# Patient Record
Sex: Female | Born: 1995 | Race: Black or African American | Hispanic: No | Marital: Single | Smoking: Never smoker
Health system: Southern US, Community
[De-identification: ages and names within clinical notes are randomized; demographics above are authoritative.]

## PROBLEM LIST (undated history)

## (undated) HISTORY — PX: TONSILLECTOMY: SUR1361

## (undated) HISTORY — PX: CYST REMOVAL NECK: SHX6281

---
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
## 2017-07-03 ENCOUNTER — Other Ambulatory Visit: Payer: Self-pay

## 2017-07-03 ENCOUNTER — Encounter: Payer: Self-pay | Admitting: Emergency Medicine

## 2017-07-03 ENCOUNTER — Emergency Department: Payer: No Typology Code available for payment source

## 2017-07-03 ENCOUNTER — Emergency Department
Admission: EM | Admit: 2017-07-03 | Discharge: 2017-07-03 | Disposition: A | Discharge status: Home / Self Care | Payer: No Typology Code available for payment source | Attending: Emergency Medicine | Admitting: Emergency Medicine

## 2017-07-03 DIAGNOSIS — Y92410 Unspecified street and highway as the place of occurrence of the external cause: Secondary | ICD-10-CM | POA: Diagnosis not present

## 2017-07-03 DIAGNOSIS — Y999 Unspecified external cause status: Secondary | ICD-10-CM | POA: Diagnosis not present

## 2017-07-03 DIAGNOSIS — M545 Low back pain, unspecified: Secondary | ICD-10-CM

## 2017-07-03 DIAGNOSIS — S3992XA Unspecified injury of lower back, initial encounter: Secondary | ICD-10-CM | POA: Diagnosis present

## 2017-07-03 DIAGNOSIS — Y9389 Activity, other specified: Secondary | ICD-10-CM | POA: Diagnosis not present

## 2017-07-03 LAB — POCT PREGNANCY, URINE: Preg Test, Ur: NEGATIVE

## 2017-07-03 MED ORDER — NAPROXEN 500 MG PO TABS: 500.0000 mg | ORAL_TABLET | Freq: Two times a day (BID) | ORAL | 0 refills | Status: DC
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
## 2019-12-22 ENCOUNTER — Emergency Department
Admission: EM | Admit: 2019-12-22 | Discharge: 2019-12-22 | Disposition: A | Discharge status: Home / Self Care | Payer: BC Managed Care – PPO | Attending: Emergency Medicine | Admitting: Emergency Medicine

## 2019-12-22 ENCOUNTER — Other Ambulatory Visit: Payer: Self-pay

## 2019-12-22 ENCOUNTER — Encounter: Payer: Self-pay | Admitting: Emergency Medicine

## 2019-12-22 DIAGNOSIS — J069 Acute upper respiratory infection, unspecified: Secondary | ICD-10-CM | POA: Diagnosis not present

## 2019-12-22 DIAGNOSIS — B309 Viral conjunctivitis, unspecified: Secondary | ICD-10-CM | POA: Diagnosis present

## 2019-12-22 MED ORDER — KETOROLAC TROMETHAMINE 0.5 % OP SOLN: 1.0000 [drp] | Freq: Four times a day (QID) | OPHTHALMIC | 0 refills | Status: AC

## 2019-12-22 MED ORDER — FLUTICASONE PROPIONATE 50 MCG/ACT NA SUSP: 2.0000 | Freq: Every day | NASAL | 0 refills | Status: AC

## 2019-12-22 MED ORDER — ERYTHROMYCIN 5 MG/GM OP OINT
1.0000 "application " | TOPICAL_OINTMENT | Freq: Four times a day (QID) | OPHTHALMIC | Status: DC
  Administered 2019-12-22: 1 via OPHTHALMIC
  Filled 2019-12-22: qty 1

## 2019-12-22 MED ORDER — FLUORESCEIN SODIUM 1 MG OP STRP
1.0000 | ORAL_STRIP | Freq: Once | OPHTHALMIC | Status: AC
  Administered 2019-12-22: 1 via OPHTHALMIC
  Filled 2019-12-22: qty 1

## 2019-12-22 MED ORDER — TETRACAINE HCL 0.5 % OP SOLN
2.0000 [drp] | Freq: Once | OPHTHALMIC | Status: AC
  Administered 2019-12-22: 2 [drp] via OPHTHALMIC
  Filled 2019-12-22: qty 4

## 2019-12-22 NOTE — Discharge Instructions (Addendum)
Your exam is consistent with a viral URI and conjunctivitis. Use the eye drops and eye ointment as directed.

## 2019-12-22 NOTE — ED Notes (Signed)
Signature pad not working, pt verbalizes understanding of d/c instructions and when to return to ED.denies questions

## 2019-12-22 NOTE — ED Triage Notes (Signed)
Pt to ED via POV stating that she thinks she has pink eye. Pt is in NAD

## 2019-12-22 NOTE — ED Provider Notes (Signed)
[facility] Emergency Department Provider Note ____________________________________________  Time seen: 1344  I have reviewed the triage vital signs and the nursing notes.  HISTORY  Chief Complaint  Conjunctivitis  HPI Brittany Wise is a 24 y.o. female presents as up to the ED for evaluation of  right eye irritation.  Patient denies any fever, chills, or sweats.  Patient denies vision loss or vertigo.  She describes right eye irritation, tearing, and light sensitivity.  She denies any foreign body sensation or purulent drainage.  She has had symptoms including cough, congestion, runny nose.  She was concerned due to a possible contact while on vacation, the patient confirmed to have "pinkeye."  History reviewed. No pertinent past medical history.  There are no problems to display for this patient.   Past Surgical History:  Procedure Laterality Date  . CYST REMOVAL NECK      Prior to Admission medications   Medication Sig Start Date End Date Taking? Authorizing Provider  fluticasone (FLONASE) 50 MCG/ACT nasal spray Place 2 sprays into both nostrils daily. 12/22/19   Jonatha Gagen, Charlesetta Ivory, PA-C  ketorolac (ACULAR) 0.5 % ophthalmic solution Place 1 drop into the right eye 4 (four) times daily. 12/22/19   Armiyah Capron, Charlesetta Ivory, PA-C    Allergies Patient has no known allergies.  History reviewed. No pertinent family history.  Social History Social History   Tobacco Use  . Smoking status: Never Smoker  . Smokeless tobacco: Never Used  Substance Use Topics  . Alcohol use: No  . Drug use: No    Review of Systems  Constitutional: Negative for fever. Eyes: Negative for visual changes. Reports eye redness and irritation ENT: Negative for sore throat. Cardiovascular: Negative for chest pain. Respiratory: Negative for shortness of breath. Gastrointestinal: Negative for abdominal pain, vomiting and diarrhea. Skin: Negative for rash. Neurological: Negative  for headaches, focal weakness or numbness. ____________________________________________  PHYSICAL EXAM:  VITAL SIGNS: ED Triage Vitals  Enc Vitals Group     BP 12/22/19 1158 117/67     Pulse Rate 12/22/19 1158 (!) 101     Resp 12/22/19 1158 16     Temp 12/22/19 1158 99 F (37.2 C)     Temp Source 12/22/19 1158 Oral     SpO2 12/22/19 1158 98 %     Weight 12/22/19 1156 160 lb (72.6 kg)     Height 12/22/19 1156 5\' 9"  (1.753 m)     Head Circumference --      Peak Flow --      Pain Score 12/22/19 1156 0     Pain Loc --      Pain Edu? --      Excl. in GC? --     Constitutional: Alert and oriented. Well appearing and in no distress. Head: Normocephalic and atraumatic. Eyes: Conjunctivae are normal. PERRL.  No gross foreign body on exam.  No fluorescein dye uptake on exam.  Normal extraocular movements Ears: Canals clear. TMs intact bilaterally. Nose: No congestion/rhinorrhea/epistaxis. Mouth/Throat: Mucous membranes are moist. Neck: Supple. No thyromegaly. Hematological/Lymphatic/Immunological: No preauricular lymphadenopathy. Cardiovascular: Normal rate, regular rhythm. Normal distal pulses. Respiratory: Normal respiratory effort.  Musculoskeletal: Nontender with normal range of motion in all extremities.  Neurologic:  Normal gait without ataxia. Normal speech and language. No gross focal neurologic deficits are appreciated. ____________________________________________  PROCEDURES  Tetracaine 2 drops OD  erythromycin ointment OD Procedures ____________________________________________  INITIAL IMPRESSION / ASSESSMENT AND PLAN / ED COURSE  DDX: corneal abrasion, retained foreign  body, conjunctivitis, iritis  Patient with ED evaluation of right eye irritation and tearing, along with symptoms consistent with a likely viral URI.  Patient clinical exam is overall benign reassuring at this time.  No signs of blepharitis, retained foreign body, or corneal abrasion.  No significant  conjunctival injection or edema on exam.  Patient will be treated empirically for viral conductive-itis with Acular drops.  She is also given a dispensed tube of erythromycin ointment for comfort.  Work note is provided for 24 hours as is appropriate and the patient is referred to her PCP or this ED for ongoing symptoms.  Messina Kosinski was evaluated in Emergency Department on 12/22/2019 for the symptoms described in the history of present illness. She was evaluated in the context of the global COVID-19 pandemic, which necessitated consideration that the patient might be at risk for infection with the SARS-CoV-2 virus that causes COVID-19. Institutional protocols and algorithms that pertain to the evaluation of patients at risk for COVID-19 are in a state of rapid change based on information released by regulatory bodies including the CDC and federal and state organizations. These policies and algorithms were followed during the patient's care in the ED. ____________________________________________  FINAL CLINICAL IMPRESSION(S) / ED DIAGNOSES  Final diagnoses:  Viral conjunctivitis  Viral URI      Karmen Stabs, Charlesetta Ivory, PA-C 12/22/19 1625    Minna Antis, MD 12/23/19 2306

## 2023-01-08 ENCOUNTER — Ambulatory Visit
Admission: RE | Admit: 2023-01-08 | Discharge: 2023-01-08 | Disposition: A | Discharge status: Home / Self Care | Payer: BC Managed Care – PPO | Source: Ambulatory Visit | Attending: Internal Medicine | Admitting: Internal Medicine

## 2023-01-08 VITALS — BP 130/82 | HR 95 | Temp 98.2°F | Resp 16

## 2023-01-08 DIAGNOSIS — J029 Acute pharyngitis, unspecified: Secondary | ICD-10-CM | POA: Insufficient documentation

## 2023-01-08 DIAGNOSIS — B349 Viral infection, unspecified: Secondary | ICD-10-CM | POA: Diagnosis present

## 2023-01-08 DIAGNOSIS — Z1152 Encounter for screening for COVID-19: Secondary | ICD-10-CM | POA: Diagnosis not present

## 2023-01-08 LAB — POCT RAPID STREP A (OFFICE): Rapid Strep A Screen: NEGATIVE

## 2023-01-08 MED ORDER — BENZONATATE 200 MG PO CAPS
200.0000 mg | ORAL_CAPSULE | Freq: Three times a day (TID) | ORAL | 0 refills | Status: AC | PRN
Start: 2023-01-08 — End: ?

## 2023-01-08 MED ORDER — IPRATROPIUM BROMIDE 0.03 % NA SOLN
2.0000 | Freq: Two times a day (BID) | NASAL | 0 refills | Status: AC
Start: 2023-01-08 — End: ?

## 2023-01-08 NOTE — Discharge Instructions (Addendum)
The clinic will contact you with results of the strep throat culture as well as the COVID testing done today.  You may take Tessalon as needed for cough.  Nasal spray as needed for congestion.  Salt water gargles and warm liquids will help with your throat pain.  Lots of rest and fluids.  Please follow-up with your PCP if your symptoms do not improve.  Please go to the emergency room if you develop any worsening symptoms.  I hope you feel better soon!

## 2023-01-08 NOTE — ED Provider Notes (Signed)
[facility]    CSN: [id number] Arrival date & time: 01/08/23  1324      History   Chief Complaint Chief Complaint  Patient presents with   Sore Throat    I believe I may have strep throat and would like to be tested. Thank you! - Entered by patient    HPI Brittany Wise is a 27 y.o. female  presents for evaluation of URI symptoms for 1 days. Patient reports associated symptoms of cough, congestion, sore throat. Denies N/V/D, fevers, ear pain, body aches, shortness of breath. 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 No known sick contacts.  Pt has taken ibuprofen OTC for symptoms. Pt has no other concerns at this time.    Sore Throat    History reviewed. No pertinent past medical history.  There are no problems to display for this patient.   Past Surgical History:  Procedure Laterality Date   CYST REMOVAL NECK     TONSILLECTOMY      OB History   No obstetric history on file.      Home Medications    Prior to Admission medications   Medication Sig Start Date End Date Taking? Authorizing Provider  benzonatate (TESSALON) 200 MG capsule Take 1 capsule (200 mg total) by mouth 3 (three) times daily as needed. 01/08/23  Yes Radford Pax, NP  ipratropium (ATROVENT) 0.03 % nasal spray Place 2 sprays into both nostrils every 12 (twelve) hours. 01/08/23  Yes Radford Pax, NP  fluticasone (FLONASE) 50 MCG/ACT nasal spray Place 2 sprays into both nostrils daily. 12/22/19   Menshew, Charlesetta Ivory, PA-C  ketorolac (ACULAR) 0.5 % ophthalmic solution Place 1 drop into the right eye 4 (four) times daily. 12/22/19   Menshew, Charlesetta Ivory, PA-C    Family History History reviewed. No pertinent family history.  Social History Social History   Tobacco Use   Smoking status: Never   Smokeless tobacco: Never  Substance Use Topics   Alcohol use: No   Drug use: No     Allergies   Patient has no known allergies.   Review of Systems Review  of Systems  HENT:  Positive for congestion and sore throat.   Respiratory:  Positive for cough.      Physical Exam Triage Vital Signs ED Triage Vitals  Encounter Vitals Group     BP 01/08/23 1336 130/82     Systolic BP Percentile --      Diastolic BP Percentile --      Pulse Rate 01/08/23 1336 95     Resp 01/08/23 1336 16     Temp 01/08/23 1336 98.2 F (36.8 C)     Temp Source 01/08/23 1336 Oral     SpO2 01/08/23 1336 98 %     Weight --      Height --      Head Circumference --      Peak Flow --      Pain Score 01/08/23 1339 3     Pain Loc --      Pain Education --      Exclude from Growth Chart --    No data found.  Updated Vital Signs BP 130/82 (BP Location: Right Arm)   Pulse 95   Temp 98.2 F (36.8 C) (Oral)   Resp 16   LMP 01/05/2023 (Approximate)   SpO2 98%   Visual Acuity Right Eye Distance:   Left Eye Distance:   Bilateral  Distance:    Right Eye Near:   Left Eye Near:    Bilateral Near:     Physical Exam Vitals and nursing note reviewed.  Constitutional:      General: She is not in acute distress.    Appearance: She is well-developed. She is not ill-appearing.  HENT:     Head: Normocephalic and atraumatic.     Right Ear: Tympanic membrane and ear canal normal.     Left Ear: Tympanic membrane and ear canal normal.     Nose: Congestion present.     Mouth/Throat:     Mouth: Mucous membranes are moist.     Pharynx: Oropharynx is clear. Uvula midline. Posterior oropharyngeal erythema present.     Tonsils: No tonsillar exudate or tonsillar abscesses.  Eyes:     Conjunctiva/sclera: Conjunctivae normal.     Pupils: Pupils are equal, round, and reactive to light.  Cardiovascular:     Rate and Rhythm: Normal rate and regular rhythm.     Heart sounds: Normal heart sounds.  Pulmonary:     Effort: Pulmonary effort is normal.     Breath sounds: Normal breath sounds.  Musculoskeletal:     Cervical back: Normal range of motion and neck supple.   Lymphadenopathy:     Cervical: No cervical adenopathy.  Skin:    General: Skin is warm and dry.  Neurological:     General: No focal deficit present.     Mental Status: She is alert and oriented to person, place, and time.  Psychiatric:        Mood and Affect: Mood normal.        Behavior: Behavior normal.      UC Treatments / Results  Labs (all labs ordered are listed, but only abnormal results are displayed) Labs Reviewed  CULTURE, GROUP A STREP ([facility])  SARS CORONAVIRUS 2 (TAT 6-24 HRS)  POCT RAPID STREP A (OFFICE)    EKG   Radiology No results found.  Procedures Procedures (including critical care time)  Medications Ordered in UC Medications - No data to display  Initial Impression / Assessment and Plan / UC Course  I have reviewed the triage vital signs and the nursing notes.  Pertinent labs & imaging results that were available during my care of the patient were reviewed by me and considered in my medical decision making (see chart for details).     Reviewed exam and symptoms with patient.  No red flags.  Negative rapid strep will culture.  COVID PCR and will contact if positive.  Discussed viral illness and symptomatic treatment.  Tessalon as needed for cough and Atrovent nasal spray for congestion.  PCP follow-up if symptoms do not improve.  ER precautions reviewed and patient verbalized understanding.  Final diagnoses:  Sore throat  Viral illness     Discharge Instructions      The clinic will contact you with results of the strep throat culture as well as the COVID testing done today.  You may take Tessalon as needed for cough.  Nasal spray as needed for congestion.  Salt water gargles and warm liquids will help with your throat pain.  Lots of rest and fluids.  Please follow-up with your PCP if your symptoms do not improve.  Please go to the emergency room if you develop any worsening symptoms.  I hope you feel better soon!   ED Prescriptions      Medication Sig Dispense Auth. Provider   benzonatate (TESSALON) 200 MG capsule  Take 1 capsule (200 mg total) by mouth 3 (three) times daily as needed. 20 capsule Radford Pax, NP   ipratropium (ATROVENT) 0.03 % nasal spray Place 2 sprays into both nostrils every 12 (twelve) hours. 30 mL Radford Pax, NP      PDMP not reviewed this encounter.   Radford Pax, NP 01/08/23 1352

## 2023-01-08 NOTE — ED Triage Notes (Signed)
Pt presents to UC w/ c/o sore throat, nasal congestion since last night. Ibuprofen helped some.

## 2023-01-09 LAB — SARS CORONAVIRUS 2 (TAT 6-24 HRS): SARS Coronavirus 2: NEGATIVE

## 2023-01-11 LAB — CULTURE, GROUP A STREP (THRC)
